# Patient Record
Sex: Male | Born: 1995 | Race: Black or African American | Hispanic: No | Marital: Single | Smoking: Never smoker
Health system: Southern US, Community
[De-identification: ages and names within clinical notes are randomized; demographics above are authoritative.]

---
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
## 2024-02-09 ENCOUNTER — Emergency Department (HOSPITAL_COMMUNITY): Payer: Self-pay

## 2024-02-09 ENCOUNTER — Other Ambulatory Visit: Payer: Self-pay

## 2024-02-09 ENCOUNTER — Emergency Department (HOSPITAL_COMMUNITY)
Admission: EM | Admit: 2024-02-09 | Discharge: 2024-02-10 | Disposition: A | Discharge status: Home / Self Care | Payer: Self-pay | Attending: Emergency Medicine | Admitting: Emergency Medicine

## 2024-02-09 ENCOUNTER — Encounter (HOSPITAL_COMMUNITY): Payer: Self-pay

## 2024-02-09 DIAGNOSIS — Y92410 Unspecified street and highway as the place of occurrence of the external cause: Secondary | ICD-10-CM | POA: Diagnosis not present

## 2024-02-09 DIAGNOSIS — M79673 Pain in unspecified foot: Secondary | ICD-10-CM

## 2024-02-09 DIAGNOSIS — S61412A Laceration without foreign body of left hand, initial encounter: Secondary | ICD-10-CM | POA: Insufficient documentation

## 2024-02-09 DIAGNOSIS — S61411A Laceration without foreign body of right hand, initial encounter: Secondary | ICD-10-CM | POA: Diagnosis not present

## 2024-02-09 DIAGNOSIS — M79671 Pain in right foot: Secondary | ICD-10-CM | POA: Diagnosis present

## 2024-02-09 MED ORDER — NAPROXEN 250 MG PO TABS
500.0000 mg | ORAL_TABLET | Freq: Once | ORAL | Status: AC
  Administered 2024-02-10: 500 mg via ORAL
  Filled 2024-02-09: qty 2

## 2024-02-09 MED ORDER — MELOXICAM 15 MG PO TABS: 15.0000 mg | ORAL_TABLET | Freq: Every day | ORAL | 0 refills | Status: AC

## 2024-02-09 MED ORDER — OXYCODONE-ACETAMINOPHEN 5-325 MG PO TABS
1.0000 | ORAL_TABLET | Freq: Once | ORAL | Status: AC
  Administered 2024-02-10: 1 via ORAL
  Filled 2024-02-09: qty 1
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
## 2024-02-11 ENCOUNTER — Emergency Department (HOSPITAL_COMMUNITY)
Admission: EM | Admit: 2024-02-11 | Discharge: 2024-02-11 | Disposition: A | Discharge status: Home / Self Care | Attending: Emergency Medicine | Admitting: Emergency Medicine

## 2024-02-11 ENCOUNTER — Other Ambulatory Visit: Payer: Self-pay

## 2024-02-11 ENCOUNTER — Emergency Department (HOSPITAL_COMMUNITY)

## 2024-02-11 ENCOUNTER — Encounter (HOSPITAL_COMMUNITY): Payer: Self-pay

## 2024-02-11 DIAGNOSIS — S93601A Unspecified sprain of right foot, initial encounter: Secondary | ICD-10-CM | POA: Insufficient documentation

## 2024-02-11 DIAGNOSIS — S93401A Sprain of unspecified ligament of right ankle, initial encounter: Secondary | ICD-10-CM | POA: Diagnosis not present

## 2024-02-11 DIAGNOSIS — S39012A Strain of muscle, fascia and tendon of lower back, initial encounter: Secondary | ICD-10-CM | POA: Diagnosis not present

## 2024-02-11 DIAGNOSIS — Y92410 Unspecified street and highway as the place of occurrence of the external cause: Secondary | ICD-10-CM | POA: Diagnosis not present

## 2024-02-11 DIAGNOSIS — S40011A Contusion of right shoulder, initial encounter: Secondary | ICD-10-CM | POA: Diagnosis not present

## 2024-02-11 DIAGNOSIS — M545 Low back pain, unspecified: Secondary | ICD-10-CM | POA: Diagnosis present

## 2024-02-11 MED ORDER — HYDROMORPHONE HCL 1 MG/ML IJ SOLN
1.0000 mg | Freq: Once | INTRAMUSCULAR | Status: AC
  Administered 2024-02-11: 1 mg via INTRAMUSCULAR
  Filled 2024-02-11: qty 1

## 2024-02-11 MED ORDER — OXYCODONE-ACETAMINOPHEN 5-325 MG PO TABS: 1.0000 | ORAL_TABLET | Freq: Four times a day (QID) | ORAL | 0 refills | Status: DC | PRN
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
## 2024-02-11 NOTE — Discharge Instructions (Addendum)
 Usual crutches to ambulate with.  Keep your leg elevated is much as possible.  Follow-up with either Dr. Romeo Apple orthopedic surgeon here in [place] or Dr.Xu orthopedic doctor in [place].

## 2024-02-11 NOTE — ED Notes (Signed)
Patient refused ace wrap 
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
## 2024-02-16 ENCOUNTER — Ambulatory Visit: Payer: Self-pay | Admitting: Orthopaedic Surgery

## 2024-02-20 ENCOUNTER — Encounter: Payer: Self-pay | Admitting: Orthopedic Surgery

## 2024-02-20 ENCOUNTER — Ambulatory Visit (INDEPENDENT_AMBULATORY_CARE_PROVIDER_SITE_OTHER): Payer: Self-pay | Admitting: Orthopedic Surgery

## 2024-02-20 ENCOUNTER — Other Ambulatory Visit (INDEPENDENT_AMBULATORY_CARE_PROVIDER_SITE_OTHER): Payer: Self-pay

## 2024-02-20 VITALS — BP 116/63 | HR 53 | Ht 70.0 in | Wt 135.0 lb

## 2024-02-20 DIAGNOSIS — M79671 Pain in right foot: Secondary | ICD-10-CM

## 2024-02-20 MED ORDER — CYCLOBENZAPRINE HCL 10 MG PO TABS: 10.0000 mg | ORAL_TABLET | Freq: Two times a day (BID) | ORAL | 0 refills | Status: AC | PRN

## 2024-02-20 NOTE — Patient Instructions (Signed)
 We were fitted for a boot in clinic today.  Recommend referral to Dr. Lajoyce Corners for further evaluation  Provided Flexeril for help with your current pain.  Continue to take Tylenol and naproxen  Elevate the foot to help with swelling   Note for work -out of work until he is evaluated by Dr. Lajoyce Corners.
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
## 2024-03-11 ENCOUNTER — Telehealth: Payer: Self-pay | Admitting: Orthopedic Surgery

## 2024-03-11 ENCOUNTER — Ambulatory Visit: Admitting: Orthopedic Surgery

## 2024-03-11 DIAGNOSIS — S93324A Dislocation of tarsometatarsal joint of right foot, initial encounter: Secondary | ICD-10-CM

## 2024-03-11 NOTE — Telephone Encounter (Signed)
 Pt informed. He will pick up tomorrow morning at our office.

## 2024-03-11 NOTE — Telephone Encounter (Signed)
 Per Dr. Julio Ohm, okay for work note to be out of 4 weeks. We will follow up at that time.

## 2024-03-11 NOTE — Telephone Encounter (Signed)
 Pt asking for a call back. Pt asking or need to be out of work. Please call pt at [phone].

## 2024-03-12 ENCOUNTER — Encounter: Payer: Self-pay | Admitting: Orthopedic Surgery
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
## 2024-04-11 ENCOUNTER — Other Ambulatory Visit (INDEPENDENT_AMBULATORY_CARE_PROVIDER_SITE_OTHER): Payer: Self-pay

## 2024-04-11 ENCOUNTER — Encounter: Payer: Self-pay | Admitting: Orthopedic Surgery

## 2024-04-11 ENCOUNTER — Ambulatory Visit: Admitting: Orthopedic Surgery

## 2024-04-11 DIAGNOSIS — S93324A Dislocation of tarsometatarsal joint of right foot, initial encounter: Secondary | ICD-10-CM | POA: Diagnosis not present

## 2024-04-11 DIAGNOSIS — M79671 Pain in right foot: Secondary | ICD-10-CM

## 2024-04-11 NOTE — Progress Notes (Signed)
   Office Visit Note   Patient: Alexander Ford           Date of Birth: 1996/07/25           MRN: [id number] Visit Date: 04/11/2024              Requested by: No referring provider defined for this encounter. PCP: Patient, No Pcp Per  Chief Complaint  Patient presents with   Right Foot - Follow-up      HPI: Patient is a 28 year old gentleman who presents in follow-up for right foot Lisfranc fracture dislocation.  We have discussed surgical intervention and patient states he would like to continue with conservative therapy.  Assessment & Plan: Visit Diagnoses:  1. Dislocation of tarsometatarsal joint of right foot, initial encounter   2. Pain in right foot     Plan: Patient will continue with the fracture boot and crutches minimizing weightbearing on the right foot.  Patient was provided a note to be continued out of work for 4 weeks.  Repeat three-view radiographs of the right foot standing at follow-up.  Follow-Up Instructions: Return in about 4 weeks (around 05/09/2024).   Ortho Exam  Patient is alert, oriented, no adenopathy, well-dressed, normal affect, normal respiratory effort. Examination patient has a palpable pulse.  He has good ankle and subtalar motion with pain to palpation across the midfoot.  Patient states his foot is feeling better but still hurts with weightbearing.  Patient states he is following up with a doctor in [facility] about shoulder symptoms.  Imaging: XR Foot Complete Right Result Date: 04/11/2024 Three-view radiographs of the right foot shows comminution between the medial and middle cuneiform with a widening of the Lisfranc complex.  The base of the fourth metatarsal is aligned with the cuboid.  No images are attached to the encounter.  Labs: No results found for: "HGBA1C", "ESRSEDRATE", "CRP", "LABURIC", "REPTSTATUS", "GRAMSTAIN", "CULT", "LABORGA"   No results found for: "ALBUMIN", "PREALBUMIN", "CBC"  No results found for: "MG" No  results found for: "VD25OH"  No results found for: "PREALBUMIN"     No data to display           There is no height or weight on file to calculate BMI.  Orders:  Orders Placed This Encounter  Procedures   XR Foot Complete Right   No orders of the defined types were placed in this encounter.    Procedures: No procedures performed  Clinical Data: No additional findings.  ROS:  All other systems negative, except as noted in the HPI. Review of Systems  Objective: Vital Signs: There were no vitals taken for this visit.  Specialty Comments:  No specialty comments available.  PMFS History: There are no active problems to display for this patient.  History reviewed. No pertinent past medical history.  History reviewed. No pertinent family history.  History reviewed. No pertinent surgical history. Social History   Occupational History   Not on file  Tobacco Use   Smoking status: Never   Smokeless tobacco: Not on file  Substance and Sexual Activity   Alcohol use: No   Drug use: No   Sexual activity: Not on file

## 2024-05-13 ENCOUNTER — Ambulatory Visit: Admitting: Orthopedic Surgery

## 2024-05-13 ENCOUNTER — Encounter: Payer: Self-pay | Admitting: Orthopedic Surgery

## 2024-05-13 ENCOUNTER — Other Ambulatory Visit (INDEPENDENT_AMBULATORY_CARE_PROVIDER_SITE_OTHER): Payer: Self-pay

## 2024-05-13 DIAGNOSIS — S93324A Dislocation of tarsometatarsal joint of right foot, initial encounter: Secondary | ICD-10-CM
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
## 2024-06-13 ENCOUNTER — Encounter: Payer: Self-pay | Admitting: Orthopedic Surgery

## 2024-06-13 ENCOUNTER — Ambulatory Visit: Admitting: Orthopedic Surgery

## 2024-06-13 ENCOUNTER — Other Ambulatory Visit (INDEPENDENT_AMBULATORY_CARE_PROVIDER_SITE_OTHER): Payer: Self-pay

## 2024-06-13 DIAGNOSIS — M79671 Pain in right foot: Secondary | ICD-10-CM

## 2024-06-13 NOTE — Progress Notes (Signed)
   Office Visit Note   Patient: Alexander Ford           Date of Birth: [birth year]           MRN: [id number] Visit Date: 06/13/2024              Requested by: No referring provider defined for this encounter. PCP: Patient, No Pcp Per  Chief Complaint  Patient presents with   Right Foot - Follow-up      HPI: Patient is a 28 year old gentleman is seen in follow-up for closed nondisplaced right Lisfranc fracture dislocation.  Patient is currently ambulating in a cam boot with crutches nonweightbearing.  Assessment & Plan: Visit Diagnoses:  1. Right foot pain     Plan: Patient wishes to continue with conservative treatment.  Continue with protected weightbearing and three-view radiographs of the right foot at follow-up.  Follow-Up Instructions: Return in about 4 weeks (around 07/11/2024).   Ortho Exam  Patient is alert, oriented, no adenopathy, well-dressed, normal affect, normal respiratory effort. Examination there is no redness no cellulitis no blistering in the foot.  Patient states his symptoms continue to improve and he would like to continue with conservative treatment.    Imaging: XR Foot Complete Right Result Date: 06/13/2024 Three-view radiographs of the right foot shows a congruent Lisfranc complex.  No images are attached to the encounter.  Labs: No results found for: HGBA1C, ESRSEDRATE, CRP, LABURIC, REPTSTATUS, GRAMSTAIN, CULT, LABORGA   No results found for: ALBUMIN, PREALBUMIN, CBC  No results found for: MG No results found for: VD25OH  No results found for: PREALBUMIN     No data to display           There is no height or weight on file to calculate BMI.  Orders:  Orders Placed This Encounter  Procedures   XR Foot Complete Right   No orders of the defined types were placed in this encounter.    Procedures: No procedures performed  Clinical Data: No additional findings.  ROS:  All other systems  negative, except as noted in the HPI. Review of Systems  Objective: Vital Signs: There were no vitals taken for this visit.  Specialty Comments:  No specialty comments available.  PMFS History: There are no active problems to display for this patient.  History reviewed. No pertinent past medical history.  History reviewed. No pertinent family history.  History reviewed. No pertinent surgical history. Social History   Occupational History   Not on file  Tobacco Use   Smoking status: Never   Smokeless tobacco: Not on file  Substance and Sexual Activity   Alcohol use: No   Drug use: No   Sexual activity: Not on file

## 2024-07-16 ENCOUNTER — Other Ambulatory Visit (INDEPENDENT_AMBULATORY_CARE_PROVIDER_SITE_OTHER): Payer: Self-pay

## 2024-07-16 ENCOUNTER — Ambulatory Visit (INDEPENDENT_AMBULATORY_CARE_PROVIDER_SITE_OTHER): Admitting: Orthopedic Surgery

## 2024-07-16 DIAGNOSIS — S93324A Dislocation of tarsometatarsal joint of right foot, initial encounter: Secondary | ICD-10-CM

## 2024-07-17 ENCOUNTER — Encounter: Payer: Self-pay | Admitting: Orthopedic Surgery

## 2024-07-17 NOTE — Progress Notes (Signed)
 Office Visit Note   Patient: Alexander Ford           Date of Birth: 05-31-1996           MRN: [id number] Visit Date: 07/16/2024              Requested by: No referring provider defined for this encounter. PCP: Patient, No Pcp Per  Chief Complaint  Patient presents with   Right Foot - Pain, Follow-up      HPI: Discussed the use of AI scribe software for clinical note transcription with the patient, who gave verbal consent to proceed.  History of Present Illness Alexander Ford is a 28 year old male who presents for follow-up of a right foot injury.  He has been wearing a boot for approximately six months following a right foot injury. The foot feels significantly better when walking in the boot, although there is still some swelling across the midfoot. He experiences mild pain when bending the big toe, but it is not severe.  Initially, the foot was very swollen, and he had difficulty moving the toes. Currently, he has more sensation in one toe compared to another, which he attributes to the swelling. He describes the motion in the foot as returning, but with some residual stiffness.  He is eager to transition out of the boot and into regular shoes. He plans to return to work in two weeks, on September 18th.     Assessment & Plan: Visit Diagnoses:  1. Dislocation of tarsometatarsal joint of right foot, initial encounter     Plan: Assessment and Plan Assessment & Plan Healing right foot Lisfranc (tarsometatarsal) sprain Right foot Lisfranc sprain healing well, no fractures or displacement on radiograph, minimal midfoot swelling, stiffness and mild pain in big toe, overall motion improvement. - Advise transitioning to a regular shoe. - Instruct to avoid running or jumping for one month. - Advise to use the foot as comfortable after one month. - Provide a work note for return to work on September 18th.      Follow-Up Instructions: Return if symptoms worsen or fail to  improve.   Ortho Exam  Patient is alert, oriented, no adenopathy, well-dressed, normal affect, normal respiratory effort. Physical Exam EXTREMITIES: Right foot with minimal swelling. No tenderness to palpation or with distraction across Lisfranc complex.      Imaging: No results found. No images are attached to the encounter.  Labs: No results found for: HGBA1C, ESRSEDRATE, CRP, LABURIC, REPTSTATUS, GRAMSTAIN, CULT, LABORGA   No results found for: ALBUMIN, PREALBUMIN, CBC  No results found for: MG No results found for: VD25OH  No results found for: PREALBUMIN     No data to display           There is no height or weight on file to calculate BMI.  Orders:  Orders Placed This Encounter  Procedures   XR Foot Complete Right   No orders of the defined types were placed in this encounter.    Procedures: No procedures performed  Clinical Data: No additional findings.  ROS:  All other systems negative, except as noted in the HPI. Review of Systems  Objective: Vital Signs: There were no vitals taken for this visit.  Specialty Comments:  No specialty comments available.  PMFS History: There are no active problems to display for this patient.  History reviewed. No pertinent past medical history.  History reviewed. No pertinent family history.  History reviewed. No pertinent surgical history. Social  History   Occupational History   Not on file  Tobacco Use   Smoking status: Never   Smokeless tobacco: Not on file  Substance and Sexual Activity   Alcohol use: No   Drug use: No   Sexual activity: Not on file
